# Patient Record
Sex: Male | Born: 1985 | Race: White | Hispanic: No | Marital: Single | Smoking: Never smoker
Health system: Southern US, Community
[De-identification: ages and names within clinical notes are randomized; demographics above are authoritative.]

---
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
## 2015-02-26 ENCOUNTER — Other Ambulatory Visit: Payer: Self-pay | Admitting: Family Medicine

## 2015-04-21 ENCOUNTER — Emergency Department: Payer: Self-pay

## 2015-04-21 ENCOUNTER — Emergency Department
Admission: EM | Admit: 2015-04-21 | Discharge: 2015-04-21 | Disposition: A | Discharge status: Home / Self Care | Payer: Self-pay | Attending: Emergency Medicine | Admitting: Emergency Medicine

## 2015-04-21 DIAGNOSIS — Y998 Other external cause status: Secondary | ICD-10-CM | POA: Insufficient documentation

## 2015-04-21 DIAGNOSIS — S61002A Unspecified open wound of left thumb without damage to nail, initial encounter: Secondary | ICD-10-CM

## 2015-04-21 DIAGNOSIS — S62522A Displaced fracture of distal phalanx of left thumb, initial encounter for closed fracture: Secondary | ICD-10-CM | POA: Insufficient documentation

## 2015-04-21 DIAGNOSIS — Y9389 Activity, other specified: Secondary | ICD-10-CM | POA: Insufficient documentation

## 2015-04-21 DIAGNOSIS — Z792 Long term (current) use of antibiotics: Secondary | ICD-10-CM | POA: Insufficient documentation

## 2015-04-21 DIAGNOSIS — Y9289 Other specified places as the place of occurrence of the external cause: Secondary | ICD-10-CM | POA: Insufficient documentation

## 2015-04-21 DIAGNOSIS — S62502A Fracture of unspecified phalanx of left thumb, initial encounter for closed fracture: Secondary | ICD-10-CM

## 2015-04-21 DIAGNOSIS — S61012A Laceration without foreign body of left thumb without damage to nail, initial encounter: Secondary | ICD-10-CM | POA: Insufficient documentation

## 2015-04-21 MED ORDER — LIDOCAINE HCL (PF) 1 % IJ SOLN
INTRAMUSCULAR | Status: AC
  Administered 2015-04-21: 10 mL via INTRADERMAL
  Filled 2015-04-21: qty 10

## 2015-04-21 MED ORDER — LIDOCAINE HCL (PF) 1 % IJ SOLN
10.0000 mL | Freq: Once | INTRAMUSCULAR | Status: AC
  Administered 2015-04-21: 10 mL via INTRADERMAL

## 2015-04-21 MED ORDER — OXYCODONE-ACETAMINOPHEN 5-325 MG PO TABS: 1.0000 | ORAL_TABLET | Freq: Four times a day (QID) | ORAL | Status: DC | PRN

## 2015-04-21 NOTE — ED Notes (Signed)
Pt states that he was operating a piece of farm equiptment that smashed his left thumb, pt states that he thought part of his thumb was hanging off, injury appears to be a crush injury involving the tip of the left thumb, area cleaned and covered with gauze dressing

## 2015-04-21 NOTE — ED Provider Notes (Signed)
[facility] Emergency Department Provider Note ?  ? ____________________________________________ ? Time seen: 10:15 PM ? I have reviewed the triage vital signs and the nursing notes.  ________ HISTORY ? Chief Complaint Laceration     HPI  Jesse Lopez is a 30 y.o. male   who presents emergency department complaining of left thumb pain. He states that he was operating a tractor when his thumb became caught in a door. 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 He endorses pain to the distal aspect of the left thumb. He does endorse a "intrusion" from the distal aspect of his finger. Bleeding is controlled prior to arrival. ? ? ? No past medical history on file.  There are no active problems to display for this patient.  ? No past surgical history on file. ? Current Outpatient Rx  Name  Route  Sig  Dispense  Refill  . doxycycline (VIBRA-TABS) 100 MG tablet      take 1 tablet by mouth twice a day   20 tablet   0   . oxyCODONE-acetaminophen (ROXICET) 5-325 MG tablet   Oral   Take 1 tablet by mouth every 6 (six) hours as needed for severe pain.   20 tablet   0    ? Allergies Review of patient's allergies indicates no known allergies. ? No family history on file. ? Social History Social History  Substance Use Topics  . Smoking status: Not on file  . Smokeless tobacco: Not on file  . Alcohol Use: Not on file   ? Review of Systems Constitutional: no fever. Eyes: no discharge ENT: no sore throat. Cardiovascular: no chest pain. Respiratory: no cough. No sob Gastrointestinal: denies abdominal pain, vomiting, diarrhea, and constipation Genitourinary: no dysuria. Negative for hematuria Musculoskeletal: Negative for back pain. Skin: Negative for rash. Neurological: Negative for headaches  10-point ROS otherwise negative.  _______________ PHYSICAL EXAM: ? VITAL SIGNS:   ED Triage Vitals  Enc Vitals Group     BP  04/21/15 1936 133/75 mmHg     Pulse Rate 04/21/15 1936 58     Resp 04/21/15 1936 18     Temp 04/21/15 1936 97.4 F (36.3 C)     Temp Source 04/21/15 1936 Oral     SpO2 04/21/15 1936 97 %     Weight 04/21/15 1936 190 lb (86.183 kg)     Height 04/21/15 1936 6' (1.829 m)     Head Cir --      Peak Flow --      Pain Score 04/21/15 1937 5     Pain Loc --      Pain Edu? --      Excl. in GC? --    ?  Constitutional: Alert and oriented. Well appearing and in no distress. Eyes: Conjunctivae are normal.  ENT      Head: Normocephalic and atraumatic.      Ears:       Nose: No congestion/rhinnorhea.      Mouth/Throat: Mucous membranes are moist.   Hematological/Lymphatic/Immunilogical: No cervical lymphadenopathy. Cardiovascular: Normal rate, regular rhythm. Normal S1 and S2. Respiratory: Normal respiratory effort without tachypnea nor retractions. Lungs CTAB. Gastrointestinal: Soft and nontender. No distention. There is no CVA tenderness. Genitourinary:  Musculoskeletal: Nontender with normal range of motion in all extremities. No visible deformity to distal aspect of thumb. Tenderness to palpation over the distal phalanx. Full range of motion to thumb. Sensation intact to the extreme distal aspect.  Capillary refill is not assessed due to skin avulsion surrounding nailbed. Neurologic:  Normal speech and language. No gross focal neurologic deficits are appreciated. Skin:  Skin is warm, dry and intact. No rash noted. Skin avulsion to the distal aspect of left thumb.  Psychiatric: Mood and affect are normal. Speech and behavior are normal. Patient exhibits appropriate insight and judgment.    LABS (all labs ordered are listed, but only abnormal results are displayed)  Labs Reviewed - No data to display  ___________ RADIOLOGY  Left thumb x-ray Impression: Comminuted fracture involving the distal phalanx.  I personally reviewed the images.  _____________ PROCEDURES ? Procedure(s)  performed:     LACERATION REPAIR Performed by: Racheal Patches Authorized by: Delorise Royals Codie Hainer Consent: Verbal consent obtained. Risks and benefits: risks, benefits and alternatives were discussed Consent given by: patient Patient identity confirmed: provided demographic data Prepped and Draped in normal sterile fashion Wound explored  Laceration Location: Distal left thumb  Laceration Length: 2 cm  No Foreign Bodies seen or palpated  Anesthesia: Digital block   Local anesthetic: lidocaine 1 % without epinephrine  Anesthetic total: 7 ml  Irrigation method: syringe Amount of cleaning: standard  Distal skin avulsion with debrided using an 11 blade. Area was covered with Surgicel. There is no nailbed involvement.  Patient tolerance: Patient tolerated the procedure well with no immediate complications.    SPLINT APPLICATION Date/Time: 10:26 PM Authorized by: Racheal Patches Consent: Verbal consent obtained. Risks and benefits: risks, benefits and alternatives were discussed Consent given by: patient Splint applied by: Lenord Fellers, orthopedic technician Location details: Left thumb  Splint type: thumb spica  Supplies used: Ortho-Glass  Post-procedure: The splinted body part was neurovascularly unchanged following the procedure. Patient tolerance: Patient tolerated the procedure well with no immediate complications.       Medications  lidocaine (PF) (XYLOCAINE) 1 % injection 10 mL (not administered)  lidocaine (PF) (XYLOCAINE) 1 % injection (not administered)    ______________________________________________________ INITIAL IMPRESSION / ASSESSMENT AND PLAN / ED COURSE ? Pertinent labs & imaging results that were available during my care of the patient were reviewed by me and considered in my medical decision making (see chart for details).    Patient presents to the emergency department with left thumb injury. X-rays was assaulted with a comminuted  fracture to the distal phalanx. Patient has skin avulsion to the distal aspect that was debrided using an 11 blade scalpel to remove excess tissue/skin. Area was covered in Surgicel and wrapped. Patient's thumb was then splinted using a thumb spica splint. Patient is to follow-up with orthopedics for further evaluation and treatment of distal phalanx fracture. Patient will be discharged home with pain medication for breakthrough pain. Patient verbalizes understating of diagnosis and treatment plan and verbalizes compliance of same.    New Prescriptions   OXYCODONE-ACETAMINOPHEN (ROXICET) 5-325 MG TABLET    Take 1 tablet by mouth every 6 (six) hours as needed for severe pain.   ____________________________________________ FINAL CLINICAL IMPRESSION(S) / ED DIAGNOSES?  Final diagnoses:  Thumb fracture, left, closed, initial encounter  Avulsion of skin of thumb, left, initial encounter            Racheal Patches, PA-C 04/21/15 2234  Jennye Moccasin, MD 05/16/15 1622

## 2015-04-21 NOTE — Discharge Instructions (Signed)
Cast or Splint Care °Casts and splints support injured limbs and keep bones from moving while they heal. It is important to care for your cast or splint at home.   °HOME CARE INSTRUCTIONS °· Keep the cast or splint uncovered during the drying period. It can take 24 to 48 hours to dry if it is made of plaster. A fiberglass cast will dry in less than 1 hour. °· Do not rest the cast on anything harder than a pillow for the first 24 hours. °· Do not put weight on your injured limb or apply pressure to the cast until your health care provider gives you permission. °· Keep the cast or splint dry. Wet casts or splints can lose their shape and may not support the limb as well. A wet cast that has lost its shape can also create harmful pressure on your skin when it dries. Also, wet skin can become infected. °· Cover the cast or splint with a plastic bag when bathing or when out in the rain or snow. If the cast is on the trunk of the body, take sponge baths until the cast is removed. °· If your cast does become wet, dry it with a towel or a blow dryer on the cool setting only. °· Keep your cast or splint clean. Soiled casts may be wiped with a moistened cloth. °· Do not place any hard or soft foreign objects under your cast or splint, such as cotton, toilet paper, lotion, or powder. °· Do not try to scratch the skin under the cast with any object. The object could get stuck inside the cast. Also, scratching could lead to an infection. If itching is a problem, use a blow dryer on a cool setting to relieve discomfort. °· Do not trim or cut your cast or remove padding from inside of it. °· Exercise all joints next to the injury that are not immobilized by the cast or splint. For example, if you have a long leg cast, exercise the hip joint and toes. If you have an arm cast or splint, exercise the shoulder, elbow, thumb, and fingers. °· Elevate your injured arm or leg on 1 or 2 pillows for the first 1 to 3 days to decrease  swelling and pain. It is best if you can comfortably elevate your cast so it is higher than your heart. °SEEK MEDICAL CARE IF:  °· Your cast or splint cracks. °· Your cast or splint is too tight or too loose. °· You have unbearable itching inside the cast. °· Your cast becomes wet or develops a soft spot or area. °· You have a bad smell coming from inside your cast. °· You get an object stuck under your cast. °· Your skin around the cast becomes red or raw. °· You have new pain or worsening pain after the cast has been applied. °SEEK IMMEDIATE MEDICAL CARE IF:  °· You have fluid leaking through the cast. °· You are unable to move your fingers or toes. °· You have discolored (blue or white), cool, painful, or very swollen fingers or toes beyond the cast. °· You have tingling or numbness around the injured area. °· You have severe pain or pressure under the cast. °· You have any difficulty with your breathing or have shortness of breath. °· You have chest pain. °  °This information is not intended to replace advice given to you by your health care provider. Make sure you discuss any questions you have with your health care   provider. °  °Document Released: 03/27/2000 Document Revised: 01/18/2013 Document Reviewed: 10/06/2012 °Elsevier Interactive Patient Education ©2016 [employer] ° °Finger Fracture °Fractures of fingers are breaks in the bones of the fingers. There are many types of fractures. There are different ways of treating these fractures. Your health care provider will discuss the best way to treat your fracture. °CAUSES °Traumatic injury is the main cause of broken fingers. These include: °· Injuries while playing sports. °· Workplace injuries. °· Falls. °RISK FACTORS °Activities that can increase your risk of finger fractures include: °· Sports. °· Workplace activities that involve machinery. °· A condition called osteoporosis, which can make your bones less dense and cause them to fracture more  easily. °SIGNS AND SYMPTOMS °The main symptoms of a broken finger are pain and swelling within 15 minutes after the injury. Other symptoms include: °· Bruising of your finger. °· Stiffness of your finger. °· Numbness of your finger. °· Exposed bones (compound fracture) if the fracture is severe. °DIAGNOSIS  °The best way to diagnose a broken bone is with X-ray imaging. Additionally, your health care provider will use this X-ray image to evaluate the position of the broken finger bones.  °TREATMENT  °Finger fractures can be treated with:  °· Nonreduction--This means the bones are in place. The finger is splinted without changing the positions of the bone pieces. The splint is usually left on for about a week to 10 days. This will depend on your fracture and what your health care provider thinks. °· Closed reduction--The bones are put back into position without using surgery. The finger is then splinted. °· Open reduction and internal fixation--The fracture site is opened. Then the bone pieces are fixed into place with pins or some type of hardware. This is seldom required. It depends on the severity of the fracture. °HOME CARE INSTRUCTIONS  °· Follow your health care provider's instructions regarding activities, exercises, and physical therapy. °· Only take over-the-counter or prescription medicines for pain, discomfort, or fever as directed by your health care provider. °SEEK MEDICAL CARE IF: °You have pain or swelling that limits the motion or use of your fingers. °SEEK IMMEDIATE MEDICAL CARE IF:  °Your finger becomes numb. °MAKE SURE YOU:  °· Understand these instructions. °· Will watch your condition. °· Will get help right away if you are not doing well or get worse. °  °This information is not intended to replace advice given to you by your health care provider. Make sure you discuss any questions you have with your health care provider. °  °Document Released: 07/12/2000 Document Revised: 01/18/2013 Document  Reviewed: 11/09/2012 °Elsevier Interactive Patient Education ©2016 [employer] ° °

## 2015-05-04 DIAGNOSIS — S62523A Displaced fracture of distal phalanx of unspecified thumb, initial encounter for closed fracture: Secondary | ICD-10-CM | POA: Insufficient documentation

## 2015-09-17 ENCOUNTER — Encounter: Payer: Self-pay | Admitting: Family Medicine

## 2015-09-17 ENCOUNTER — Ambulatory Visit (INDEPENDENT_AMBULATORY_CARE_PROVIDER_SITE_OTHER): Payer: BLUE CROSS/BLUE SHIELD | Admitting: Family Medicine

## 2015-09-17 VITALS — BP 108/64 | HR 72 | Temp 98.7°F | Resp 16 | Ht 73.0 in | Wt 201.0 lb

## 2015-09-17 DIAGNOSIS — N50811 Right testicular pain: Secondary | ICD-10-CM | POA: Diagnosis not present

## 2015-09-17 DIAGNOSIS — J45909 Unspecified asthma, uncomplicated: Secondary | ICD-10-CM | POA: Insufficient documentation

## 2015-09-17 DIAGNOSIS — Z0000 Encounter for general adult medical examination without abnormal findings: Secondary | ICD-10-CM | POA: Diagnosis not present

## 2015-09-17 LAB — POCT URINALYSIS DIPSTICK
BILIRUBIN UA: NEGATIVE
GLUCOSE UA: NEGATIVE
KETONES UA: NEGATIVE
Leukocytes, UA: NEGATIVE
Nitrite, UA: NEGATIVE
PH UA: 6
Protein, UA: NEGATIVE
Spec Grav, UA: 1.02
Urobilinogen, UA: 0.2

## 2015-09-17 MED ORDER — DOXYCYCLINE HYCLATE 100 MG PO TABS: 100.0000 mg | ORAL_TABLET | Freq: Two times a day (BID) | ORAL | Status: AC

## 2015-09-17 NOTE — Progress Notes (Signed)
Patient ID: Jesse Lopez, male   DOB: 03/25/1986, 30 y.o.   MRN: [id number]       Patient: Jesse Lopez, Male    DOB: 1986-01-13, 30 y.o.   MRN: [id number] Visit Date: 09/17/2015  Today's Provider: Dortha Kern, PA   Chief Complaint  Patient presents with  . Annual Exam  . Testicle Pain    X 1-2 month.    Subjective:    Annual physical exam Jesse Lopez is a 30 y.o. male who presents today for health maintenance and complete physical. He feels fairly well. He reports exercising ocassionally. He reports he is sleeping well.   Testicular pain Patient reports that he has had tenderness and pain in his right testicle for the last 1-2 months. Patient denies any hematuria or dysuria. Patient denies any injury. Patient reports that the pain does not radiate and is constant.   Review of Systems  Constitutional: Negative.   HENT: Negative.   Eyes: Negative.   Respiratory: Negative.   Cardiovascular: Negative.   Gastrointestinal: Negative.   Endocrine: Negative.   Genitourinary: Positive for testicular pain.  Musculoskeletal: Negative.   Skin: Negative.   Allergic/Immunologic: Negative.   Neurological: Negative.   Hematological: Negative.   Psychiatric/Behavioral: Negative.     Social History      He  reports that he has never smoked. He does not have any smokeless tobacco history on file.       Social History   Social History  . Marital Status: Single    Spouse Name: N/A  . Number of Children: N/A  . Years of Education: N/A   Social History Main Topics  . Smoking status: Never Smoker   . Smokeless tobacco: None  . Alcohol Use: None  . Drug Use: None  . Sexual Activity: Not Asked   Other Topics Concern  . None   Social History Narrative  . None   No past medical history on file.  Patient Active Problem List   Diagnosis Date Noted  . Asthma 09/17/2015  . Closed fracture of distal phalanx of thumb 05/04/2015   History reviewed. No pertinent past  surgical history.  Family History        Family Status  Relation Status Death Age  . Mother Alive   . Father Alive   . Sister Alive   . Brother Alive         His family history includes Cancer in his paternal grandfather.    No Known Allergies  Previous Medications   No medications on file    Patient Care Team: Tamsen Roers, PA as PCP - General (Family Medicine)     Objective:   Vitals: BP 108/64 mmHg  Pulse 72  Temp(Src) 98.7 F (37.1 C)  Resp 16  Ht  (1.854 m)  Wt 201 lb (91.173 kg)  BMI 26.52 kg/m2   Physical Exam  Constitutional: He is oriented to person, place, and time. He appears well-developed and well-nourished.  HENT:  Head: Normocephalic and atraumatic.  Right Ear: External ear normal.  Left Ear: External ear normal.  Nose: Nose normal.  Mouth/Throat: Oropharynx is clear and moist.  Eyes: Conjunctivae and EOM are normal. Pupils are equal, round, and reactive to light. Right eye exhibits no discharge.  Neck: Normal range of motion. Neck supple. No tracheal deviation present. No thyromegaly present.  Cardiovascular: Normal rate, regular rhythm, normal heart sounds and intact distal pulses.   No murmur heard. Pulmonary/Chest: Effort normal and breath sounds normal.  No respiratory distress. He has no wheezes. He has no rales. He exhibits no tenderness.  Abdominal: Soft. He exhibits no distension and no mass. There is no tenderness. There is no rebound and no guarding.  Genitourinary: Penis normal.  Slight tenderness posterior right testicle without masses or local lymphadenopathy.  Musculoskeletal: Normal range of motion. He exhibits no edema or tenderness.  Lymphadenopathy:    He has no cervical adenopathy.  Neurological: He is alert and oriented to person, place, and time. He has normal reflexes. No cranial nerve deficit. He exhibits normal muscle tone. Coordination normal.  Skin: Skin is warm and dry. No rash noted. No erythema.  Psychiatric:  He has a normal mood and affect. His behavior is normal. Judgment and thought content normal.    Assessment & Plan:     Routine Health Maintenance and Physical Exam  Exercise Activities and Dietary recommendations Goals    Some moderate weight lifting and running couple times a week now.       There is no immunization history on file for this patient.  Health Maintenance  Topic Date Due  . HIV Screening  11/03/1998  . TETANUS/TDAP  11/03/2002  . INFLUENZA VACCINE  11/12/2015      Discussed health benefits of physical activity, and encouraged him to engage in regular exercise appropriate for his age and condition.    -------------------------------------------------------------------- 1. Annual physical exam Good general health. Urinalysis essentially normal. Will get routine labs. Will try to research the date of his last tetanus booster (thinks it was 7 years ago for stitches in a finger). - POCT urinalysis dipstick - CBC with Differential/Platelet - Comprehensive metabolic panel - Lipid panel - TSH  2. Testicular pain, right Intermittent over the past 1-2 months. More constant the past week or two. No hematuria, dysuria or discharge. Suspect epididymitis. Will treat with antibiotic, NSAID and hot soaks. Recheck if no better in 7 - 10 days. - POCT urinalysis dipstick - CBC with Differential/Platelet - Comprehensive metabolic panel - doxycycline (VIBRA-TABS) 100 MG tablet; Take 1 tablet (100 mg total) by mouth 2 (two) times daily.  Dispense: 20 tablet; Refill: 0

## 2016-09-21 IMAGING — CR DG FINGER THUMB 2+V*L*
1 series · 3 of 3 positions shown · non-contrast
Comparison: None.

CLINICAL DATA: Crush injury to first finger working on tractor

EXAM:
LEFT THUMB 2+V

[Series 1: dg finger thumb left · 0.14mm/px · 3 of 3 slices shown]
[im 1/3]
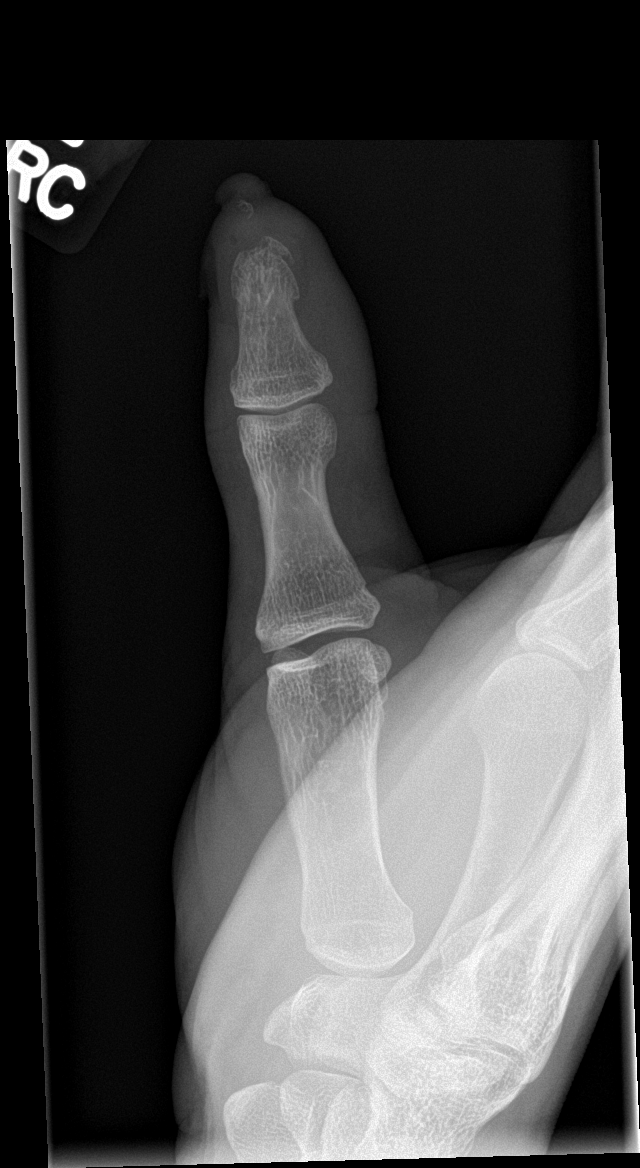
[im 2/3]
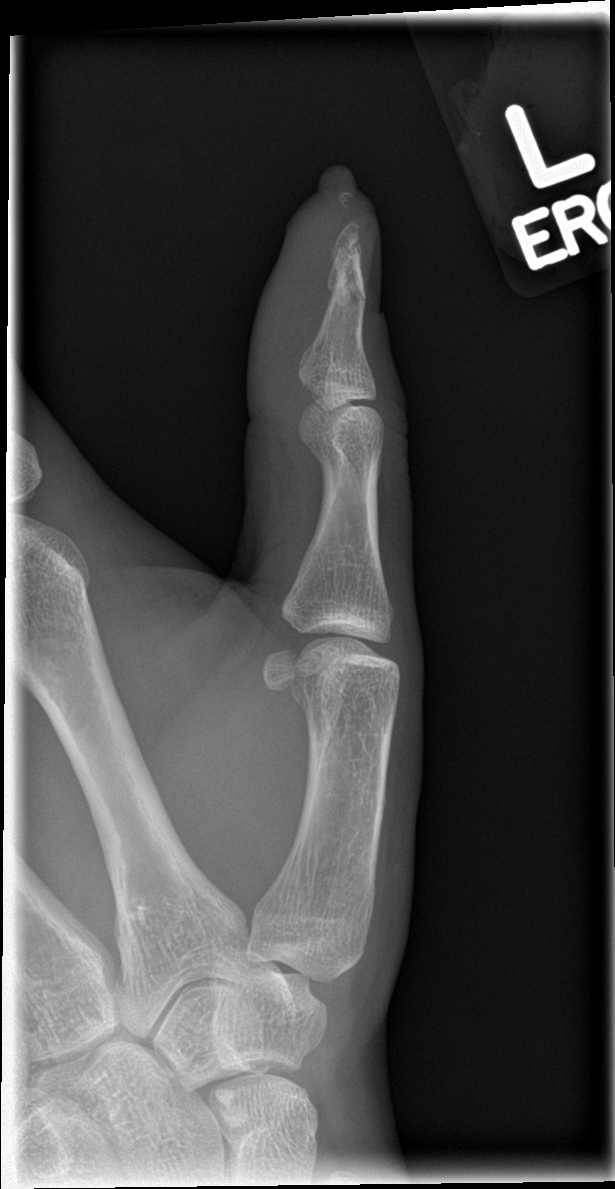
[im 3/3]
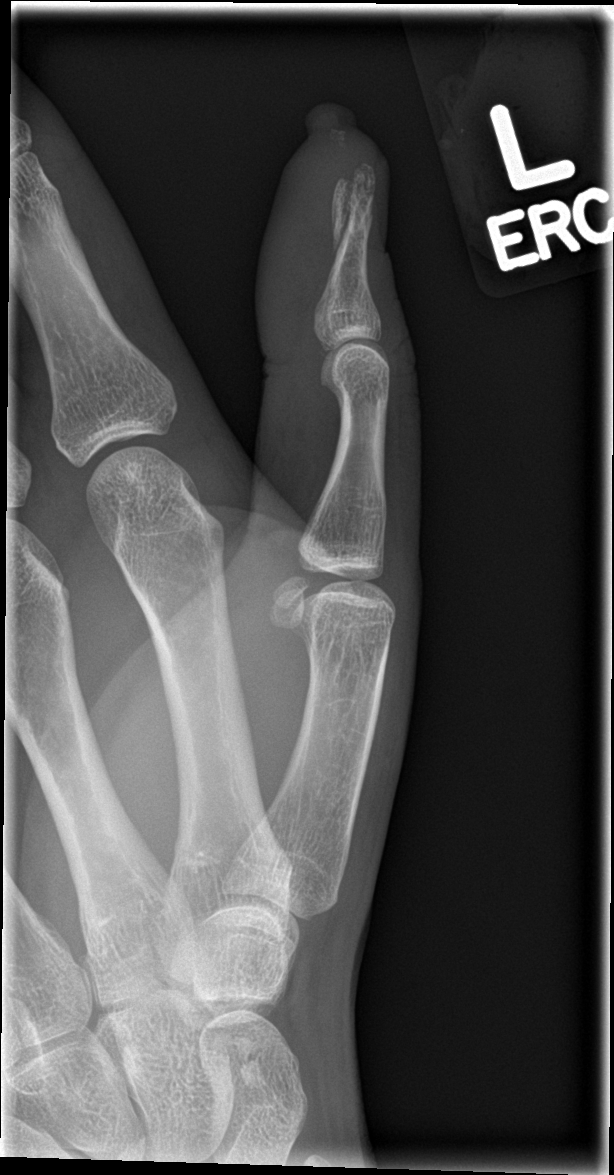

[3 of 3 positions shown; findings below may reference images not displayed]

FINDINGS: Soft tissue injury to the tip of the first finger. There is fracture
involving the tuft of the distal phalanx. Main fracture fragment
displaced by 1-2 mm distally. Smaller fracture fragment displaced by
5 mm distally.
IMPRESSION: Comminuted fracture involving the first distal phalanx

## 2019-04-03 ENCOUNTER — Telehealth: Payer: Self-pay

## 2019-04-03 NOTE — Telephone Encounter (Signed)
error
